# Patient Record
Sex: Female | Born: 2005 | Race: White | Hispanic: No | Marital: Single | Smoking: Never smoker
Health system: Southern US, Community
[De-identification: ages and names within clinical notes are randomized; demographics above are authoritative.]

## PROBLEM LIST (undated history)

## (undated) DIAGNOSIS — E739 Lactose intolerance, unspecified: Secondary | ICD-10-CM

## (undated) DIAGNOSIS — L309 Dermatitis, unspecified: Secondary | ICD-10-CM

## (undated) DIAGNOSIS — L709 Acne, unspecified: Secondary | ICD-10-CM

## (undated) HISTORY — DX: Dermatitis, unspecified: L30.9

## (undated) HISTORY — DX: Lactose intolerance, unspecified: E73.9

## (undated) HISTORY — DX: Acne, unspecified: L70.9

---
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
## 2023-02-01 ENCOUNTER — Encounter: Payer: Self-pay | Admitting: *Deleted

## 2023-05-02 ENCOUNTER — Other Ambulatory Visit (HOSPITAL_COMMUNITY)
Admission: RE | Admit: 2023-05-02 | Discharge: 2023-05-02 | Disposition: A | Discharge status: Home / Self Care | Payer: BC Managed Care – PPO | Source: Ambulatory Visit | Attending: Obstetrics & Gynecology | Admitting: Obstetrics & Gynecology

## 2023-05-02 ENCOUNTER — Other Ambulatory Visit (INDEPENDENT_AMBULATORY_CARE_PROVIDER_SITE_OTHER): Payer: BC Managed Care – PPO | Admitting: *Deleted

## 2023-05-02 DIAGNOSIS — N898 Other specified noninflammatory disorders of vagina: Secondary | ICD-10-CM | POA: Insufficient documentation

## 2023-05-02 NOTE — Progress Notes (Signed)
   NURSE VISIT- VAGINITIS/STD/POC  SUBJECTIVE:  Bianca Pham is a 17 y.o. G0P0000 GYN patientfemale here for a vaginal swab for vaginitis screening, STD screen.  She reports the following symptoms: discharge described as yellow  for several days. Denies abnormal vaginal bleeding, significant pelvic pain, fever, or UTI symptoms.  OBJECTIVE:  There were no vitals taken for this visit.  Appears well, in no apparent distress  ASSESSMENT: Vaginal swab for vaginitis screening/std  PLAN: Self-collected vaginal probe for Gonorrhea, Chlamydia, Trichomonas, Bacterial Vaginosis, Yeast sent to lab Treatment: to be determined once results are received Follow-up as needed if symptoms persist/worsen, or new symptoms develop  Annamarie Dawley  05/02/2023 2:17 PM

## 2023-05-04 LAB — CERVICOVAGINAL ANCILLARY ONLY
Bacterial Vaginitis (gardnerella): NEGATIVE
Candida Glabrata: NEGATIVE
Candida Vaginitis: POSITIVE — AB
Chlamydia: NEGATIVE
Comment: NEGATIVE
Comment: NEGATIVE
Comment: NEGATIVE
Comment: NEGATIVE
Comment: NEGATIVE
Comment: NORMAL
Neisseria Gonorrhea: NEGATIVE
Trichomonas: NEGATIVE

## 2023-05-07 ENCOUNTER — Other Ambulatory Visit: Payer: Self-pay | Admitting: Women's Health

## 2023-05-07 MED ORDER — FLUCONAZOLE 150 MG PO TABS: 150.0000 mg | ORAL_TABLET | Freq: Once | ORAL | 0 refills | Status: AC

## 2023-06-13 ENCOUNTER — Encounter (HOSPITAL_COMMUNITY): Payer: Self-pay | Admitting: *Deleted

## 2023-06-13 ENCOUNTER — Other Ambulatory Visit: Payer: Self-pay

## 2023-06-13 ENCOUNTER — Emergency Department (HOSPITAL_COMMUNITY)
Admission: EM | Admit: 2023-06-13 | Discharge: 2023-06-14 | Disposition: A | Discharge status: Home / Self Care | Payer: 59 | Attending: Emergency Medicine | Admitting: Emergency Medicine

## 2023-06-13 DIAGNOSIS — S8002XA Contusion of left knee, initial encounter: Secondary | ICD-10-CM | POA: Diagnosis not present

## 2023-06-13 DIAGNOSIS — S0083XA Contusion of other part of head, initial encounter: Secondary | ICD-10-CM | POA: Insufficient documentation

## 2023-06-13 DIAGNOSIS — S0990XA Unspecified injury of head, initial encounter: Secondary | ICD-10-CM | POA: Diagnosis present

## 2023-06-13 DIAGNOSIS — S8001XA Contusion of right knee, initial encounter: Secondary | ICD-10-CM | POA: Insufficient documentation

## 2023-06-13 DIAGNOSIS — Y92410 Unspecified street and highway as the place of occurrence of the external cause: Secondary | ICD-10-CM | POA: Insufficient documentation

## 2023-06-13 DIAGNOSIS — S8000XA Contusion of unspecified knee, initial encounter: Secondary | ICD-10-CM
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
## 2023-06-14 ENCOUNTER — Emergency Department (HOSPITAL_COMMUNITY): Payer: 59

## 2023-06-14 MED ORDER — ACETAMINOPHEN 500 MG PO TABS
1000.0000 mg | ORAL_TABLET | Freq: Once | ORAL | Status: AC
  Administered 2023-06-14: 1000 mg via ORAL
  Filled 2023-06-14: qty 2

## 2023-06-14 MED ORDER — KETOROLAC TROMETHAMINE 15 MG/ML IJ SOLN
15.0000 mg | Freq: Once | INTRAMUSCULAR | Status: AC
  Administered 2023-06-14: 15 mg via INTRAMUSCULAR
  Filled 2023-06-14: qty 1

## 2023-06-14 NOTE — Discharge Instructions (Signed)
You had a normal neurologic exam while in the emergency department.  This is reassuring.  It is possible that you may have a mild concussion.  A concussion is a diagnosis that is made clinically and does not show any abnormal results on imaging such as a CT scan or MRI.  You did have a CT performed which was normal.  A concussion may cause a persistent headache over the next few days. This can be brought on or worsened by loud sounds or bright lights. Try to avoid excessive use of cell phones, television, video games as this may worsening headaches.  Avoid strenuous activity and heavy lifting over the next few days.  If you develop severe worsening of your headache, vision changes or loss, uncontrolled vomiting, numbness or tingling to one side of your body, difficulty walking or lifting your arms or legs, return promptly to the emergency department for repeat evaluation.
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
## 2023-10-03 ENCOUNTER — Other Ambulatory Visit: Payer: Self-pay | Admitting: Adult Health

## 2023-12-25 ENCOUNTER — Other Ambulatory Visit: Payer: Self-pay | Admitting: Adult Health

## 2024-02-04 NOTE — Progress Notes (Signed)
 Subjective   Patient ID:  Bianca Pham is a 18 y.o. (DOB 2006/02/15) female    Patient presents with  . Rash     HPI  Bianca Pham is here today with a rash that is been going on for about 2 weeks.  It started as a large painful blister.  That popped and then other areas started below it and has now started on the left lower back of her legs.  She denies any sick like symptoms.  No contact with anything.  She thought it was maybe from where her shorts were rubbing on a seat where she had been to the gym.  No one else is   Reviewed and updated this visit by provider: None        Review of Systems  Constitutional: Negative.   Respiratory: Negative.    Cardiovascular: Negative.   Skin:  Positive for color change and rash.  Psychiatric/Behavioral: Negative.       Objective   Vitals:   02/04/24 1039  BP: (!) 144/92  Patient Position: Sitting  Pulse: 113  Temp: 98.4 F (36.9 C)  TempSrc: Temporal  Resp: 19  Height: 5' 3 (1.6 m)  Weight: 110 lb (49.9 kg)  SpO2: 99%  BMI (Calculated): 19.5  PainSc: 0-No pain     Physical Exam Constitutional:      Appearance: Normal appearance.  HENT:     Head: Normocephalic.  Cardiovascular:     Rate and Rhythm: Tachycardia present.  Pulmonary:     Effort: Pulmonary effort is normal.  Skin:        Comments: Healed blister to upper right leg with scabbed lesions under. New appearing blisters to left upper thigh  Neurological:     General: No focal deficit present.     Mental Status: She is alert and oriented to person, place, and time. Mental status is at baseline.  Psychiatric:        Mood and Affect: Mood normal.        Behavior: Behavior normal.        Thought Content: Thought content normal.        Judgment: Judgment normal.       Assessment and Plan  1. Bullous pemphigoid (*) (Primary) -     mupirocin (BACTROBAN) 2 % ointment; Apply to affected area 3 times daily, Normal -     amoxicillin  (AMOXIL ) 875 mg tablet; Take one  tablet (875 mg dose) by mouth 2 (two) times daily for 10 days., Starting Mon 02/04/2024, Until Thu 02/14/2024, Normal -     triamcinolone acetonide (KENALOG) 0.1% cream; Apply topically 2 (two) times daily., Starting Mon 02/04/2024, Until Tue 02/03/2025, Normal    If symptoms do not improve would recommend dermatology referral for biopsy No follow-ups on file.     Care plan and follow-up as discussed or as needed if any worsening symptoms or change in condition. Pt expressed understanding. No barriers to meeting goals. After visit summary was given to the patient.   Patient's Medications  New Prescriptions   AMOXICILLIN  (AMOXIL ) 875 MG TABLET    Take one tablet (875 mg dose) by mouth 2 (two) times daily for 10 days.   MUPIROCIN (BACTROBAN) 2 % OINTMENT    Apply to affected area 3 times daily   TRIAMCINOLONE ACETONIDE (KENALOG) 0.1% CREAM    Apply topically 2 (two) times daily.  Previous Medications   HYDROXYZINE HCL (ATARAX) 25 MG TABLET    TAKE ONE TABLET (25MG  DOSE) BY MOUTH  AT BEDTIME AS NEEDED FOR ITCHING   LO LOESTRIN FE  1 MG-10 MCG / 10 MCG TABS    Take by mouth daily.   OXCARBAZEPINE (TRILEPTAL) 150 MG TABLET    TAKE ONE TABLET (150 MG DOSE) BY MOUTH AT BEDTIME.   VENLAFAXINE HCL (EFFEXOR-XR) 37.5 MG 24 HR CAPSULE    Take one capsule (37.5 mg dose) by mouth daily.  Modified Medications   No medications on file  Discontinued Medications   No medications on file      Risks, benefits, and alternatives of the medications and treatment plan prescribed today were discussed, and patient expressed understanding. Plan follow-up as discussed or as needed if any worsening symptoms or change in condition.   A yearly preventative health exam was recommended and current age based recommendations were discussed.  I have reviewed the information contained in this note and personally verified its accuracy.  MDM billing - I personally developed the plan of care based on documented medical decision  making. Charmaine Heller, NP

## 2024-02-08 ENCOUNTER — Encounter: Payer: Self-pay | Admitting: *Deleted

## 2024-04-28 ENCOUNTER — Other Ambulatory Visit: Payer: Self-pay | Admitting: Adult Health
